# Patient Record
Sex: Female | Born: 1935 | Race: Black or African American | Hispanic: No | Marital: Married
Health system: Southern US, Community
[De-identification: ages and names within clinical notes are randomized; demographics above are authoritative.]

## PROBLEM LIST (undated history)

## (undated) DIAGNOSIS — E119 Type 2 diabetes mellitus without complications: Secondary | ICD-10-CM

## (undated) DIAGNOSIS — I219 Acute myocardial infarction, unspecified: Secondary | ICD-10-CM

## (undated) DIAGNOSIS — E785 Hyperlipidemia, unspecified: Secondary | ICD-10-CM

## (undated) DIAGNOSIS — I2510 Atherosclerotic heart disease of native coronary artery without angina pectoris: Secondary | ICD-10-CM

## (undated) DIAGNOSIS — I10 Essential (primary) hypertension: Secondary | ICD-10-CM

## (undated) HISTORY — DX: Atherosclerotic heart disease of native coronary artery without angina pectoris: I25.10

## (undated) HISTORY — DX: Acute myocardial infarction, unspecified: I21.9

## (undated) HISTORY — DX: Type 2 diabetes mellitus without complications: E11.9

## (undated) HISTORY — DX: Essential (primary) hypertension: I10

## (undated) HISTORY — DX: Hyperlipidemia, unspecified: E78.5

---

## 2013-02-07 ENCOUNTER — Emergency Department: Payer: Self-pay | Admitting: Emergency Medicine

## 2013-06-01 ENCOUNTER — Inpatient Hospital Stay: Payer: Self-pay | Admitting: Internal Medicine

## 2013-06-01 DIAGNOSIS — I10 Essential (primary) hypertension: Secondary | ICD-10-CM

## 2013-06-01 DIAGNOSIS — I209 Angina pectoris, unspecified: Secondary | ICD-10-CM

## 2013-06-01 DIAGNOSIS — E785 Hyperlipidemia, unspecified: Secondary | ICD-10-CM

## 2013-06-01 DIAGNOSIS — I359 Nonrheumatic aortic valve disorder, unspecified: Secondary | ICD-10-CM

## 2013-06-02 DIAGNOSIS — I2510 Atherosclerotic heart disease of native coronary artery without angina pectoris: Secondary | ICD-10-CM

## 2013-06-02 DIAGNOSIS — I214 Non-ST elevation (NSTEMI) myocardial infarction: Secondary | ICD-10-CM

## 2013-06-02 HISTORY — PX: CARDIAC CATHETERIZATION: SHX172

## 2013-06-03 DIAGNOSIS — I2510 Atherosclerotic heart disease of native coronary artery without angina pectoris: Secondary | ICD-10-CM

## 2013-06-04 ENCOUNTER — Telehealth: Payer: Self-pay

## 2013-06-04 NOTE — Telephone Encounter (Signed)
Attempted to contact pt regarding discharge [facility] on 06/04/13. The contact # we have is not correct.

## 2013-06-09 ENCOUNTER — Encounter: Payer: Self-pay | Admitting: Cardiology

## 2013-06-17 ENCOUNTER — Encounter: Payer: Self-pay | Admitting: Cardiovascular Disease

## 2014-04-13 ENCOUNTER — Other Ambulatory Visit: Payer: Self-pay

## 2014-04-25 DEATH — deceased

## 2015-06-09 IMAGING — CR DG CHEST 1V PORT
1 series · 1 of 1 positions shown · non-contrast
Comparison: 06/01/2013.

CLINICAL DATA: Cough and sepsis.

EXAM:
PORTABLE CHEST - 1 VIEW

[ap]
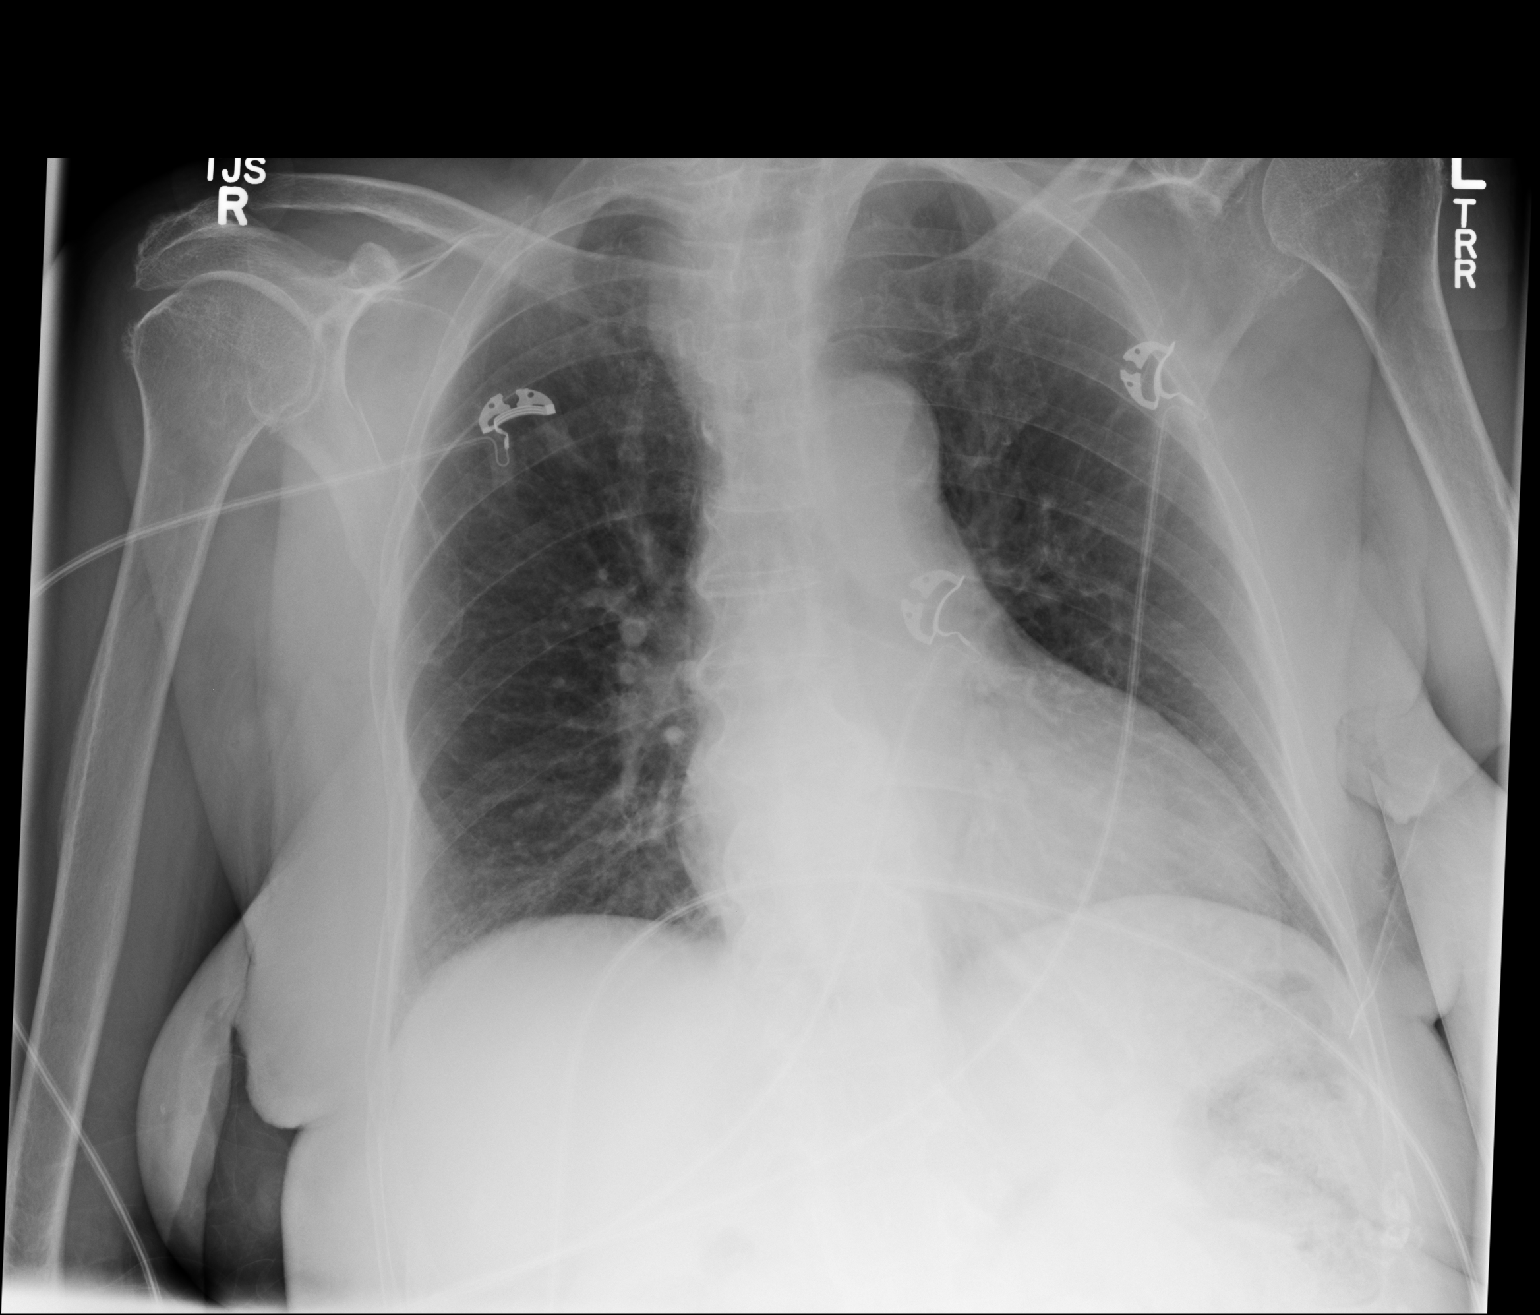

[1 of 1 positions shown; findings below may reference images not displayed]

FINDINGS: Mediastinum and hilar structures normal. Lungs are clear.
Cardiomegaly. No pleural effusion or pneumothorax. No acute bony
abnormality.
IMPRESSION: 1. Cardiomegaly.  No CHF.
2. No acute pulmonary disease.
